# Patient Record
Sex: Female | Born: 1985
Health system: Southern US, Community
[De-identification: ages and names within clinical notes are randomized; demographics above are authoritative.]

## PROBLEM LIST (undated history)

## (undated) DIAGNOSIS — J45909 Unspecified asthma, uncomplicated: Secondary | ICD-10-CM

---

## 2008-05-18 ENCOUNTER — Emergency Department (HOSPITAL_COMMUNITY): Admission: EM | Admit: 2008-05-18 | Discharge: 2008-05-18 | Payer: Self-pay | Admitting: Emergency Medicine

## 2009-01-26 ENCOUNTER — Emergency Department (HOSPITAL_COMMUNITY): Admission: EM | Admit: 2009-01-26 | Discharge: 2009-01-26 | Payer: Self-pay | Admitting: Emergency Medicine

## 2010-06-30 LAB — URINE MICROSCOPIC-ADD ON

## 2010-06-30 LAB — URINALYSIS, ROUTINE W REFLEX MICROSCOPIC
Bilirubin Urine: NEGATIVE
Hgb urine dipstick: NEGATIVE
Ketones, ur: NEGATIVE mg/dL
Nitrite: NEGATIVE
Specific Gravity, Urine: 1.016 (ref 1.005–1.030)
pH: 5.5 (ref 5.0–8.0)

## 2010-06-30 LAB — POCT PREGNANCY, URINE: Preg Test, Ur: NEGATIVE

## 2017-02-21 ENCOUNTER — Encounter (HOSPITAL_COMMUNITY): Payer: Self-pay

## 2017-02-21 ENCOUNTER — Emergency Department (HOSPITAL_COMMUNITY)
Admission: EM | Admit: 2017-02-21 | Discharge: 2017-02-21 | Disposition: A | Discharge status: Home / Self Care | Payer: BLUE CROSS/BLUE SHIELD | Attending: Emergency Medicine | Admitting: Emergency Medicine

## 2017-02-21 ENCOUNTER — Other Ambulatory Visit: Payer: Self-pay

## 2017-02-21 ENCOUNTER — Emergency Department (HOSPITAL_COMMUNITY): Payer: BLUE CROSS/BLUE SHIELD

## 2017-02-21 DIAGNOSIS — Z87891 Personal history of nicotine dependence: Secondary | ICD-10-CM | POA: Insufficient documentation

## 2017-02-21 DIAGNOSIS — J40 Bronchitis, not specified as acute or chronic: Secondary | ICD-10-CM

## 2017-02-21 DIAGNOSIS — R05 Cough: Secondary | ICD-10-CM | POA: Diagnosis not present

## 2017-02-21 HISTORY — DX: Unspecified asthma, uncomplicated: J45.909

## 2017-02-21 MED ORDER — BENZONATATE 100 MG PO CAPS: 100.0000 mg | ORAL_CAPSULE | Freq: Three times a day (TID) | ORAL | 0 refills | Status: AC

## 2017-02-21 MED ORDER — PREDNISONE 20 MG PO TABS: 40.0000 mg | ORAL_TABLET | Freq: Every day | ORAL | 0 refills | Status: AC

## 2017-02-21 MED ORDER — ALBUTEROL SULFATE HFA 108 (90 BASE) MCG/ACT IN AERS
2.0000 | INHALATION_SPRAY | Freq: Once | RESPIRATORY_TRACT | Status: AC
  Administered 2017-02-21: 2 via RESPIRATORY_TRACT
  Filled 2017-02-21: qty 6.7

## 2017-02-21 NOTE — ED Provider Notes (Signed)
[facility] EMERGENCY DEPARTMENT Provider Note   CSN: [id number] Arrival date & time: 02/21/17  1021     History   Chief Complaint No chief complaint on file.   HPI Rachel HoseSharika Booker is a 31 y.o. female.  HPI Rachel Booker is a [age] y.o. female presents to emergency department complaining of cough for the last month.  Patient states her symptoms started with "head cold."  She states she no longer has any congestion, but still coughing.  She reports productive thick mucus.  She denies any fever or chills.  She denies any chest pain.  No shortness of breath.  She does have history of bronchitis.  She used to smoke, currently does not smoke.  She states she has tried Mucinex, Robitussin, Tylenol Cold and flu with no relief of her symptoms.  She states her symptoms are worse when she is lying down at nighttime, she states she hears her have wheezing.  She states nothing is making it better.  Past Medical History:  Diagnosis Date  . Asthma     There are no active problems to display for this patient.   History reviewed. No pertinent surgical history.  OB History    No data available       Home Medications    Prior to Admission medications   Not on File    Family History No family history on file.  Social History Social History   Tobacco Use  . Smoking status: Former Games developermoker  . Smokeless tobacco: Never Used  Substance Use Topics  . Alcohol use: Not on file  . Drug use: Not on file     Allergies   Patient has no known allergies.   Review of Systems Review of Systems  Constitutional: Negative for chills and fever.  Respiratory: Positive for cough and wheezing. Negative for chest tightness and shortness of breath.   Cardiovascular: Negative for chest pain, palpitations and leg swelling.  Gastrointestinal: Negative for abdominal pain, diarrhea, nausea and vomiting.  Genitourinary: Negative for dysuria, flank pain and pelvic pain.    Musculoskeletal: Negative for arthralgias, myalgias, neck pain and neck stiffness.  Skin: Negative for rash.  Neurological: Negative for dizziness, weakness and headaches.  All other systems reviewed and are negative.    Physical Exam Updated Vital Signs BP (!) 163/100   Pulse 93   Temp 98.4 F (36.9 C) (Oral)   Resp 18   LMP 02/20/2017   SpO2 100%   Physical Exam  Constitutional: She appears well-developed and well-nourished. No distress.  HENT:  Head: Normocephalic.  Right Ear: External ear normal.  Left Ear: External ear normal.  Nose: Nose normal.  Mouth/Throat: Oropharynx is clear and moist.  Eyes: Conjunctivae are normal.  Neck: Neck supple.  Cardiovascular: Normal rate, regular rhythm and normal heart sounds.  Pulmonary/Chest: Effort normal and breath sounds normal. No stridor. No respiratory distress. She has no wheezes. She has no rales.  Abdominal: Soft. Bowel sounds are normal. She exhibits no distension. There is no tenderness. There is no rebound.  Musculoskeletal: She exhibits no edema.  Neurological: She is alert.  Skin: Skin is warm and dry.  Psychiatric: She has a normal mood and affect. Her behavior is normal.  Nursing note and vitals reviewed.    ED Treatments / Results  Labs (all labs ordered are listed, but only abnormal results are displayed) Labs Reviewed - No data to display  EKG  EKG Interpretation None       Radiology  Dg Chest 2 View  Result Date: 02/21/2017 CLINICAL DATA:  Cough and congestion. EXAM: CHEST  2 VIEW COMPARISON:  01/26/2009. FINDINGS: Mediastinum hilar structures normal. Heart size stable. Mild left base subsegmental atelectasis. No pleural effusion or pneumothorax . IMPRESSION: Mild left base subsegmental atelectasis. Electronically Signed   By: Maisie Fushomas  Register   On: 02/21/2017 10:55    Procedures Procedures (including critical care time)  Medications Ordered in ED Medications - No data to display   Initial  Impression / Assessment and Plan / ED Course  I have reviewed the triage vital signs and the nursing notes.  Pertinent labs & imaging results that were available during my care of the patient were reviewed by me and considered in my medical decision making (see chart for details).     Patient in the emergency department with cough for 1 month, with productive thick sputum, wheezing mainly at nighttime.  She does have history of smoking and bronchitis.  Her exam was unremarkable here.  Her lungs are clear.  Her oxygen is 100% on room air.  She is in no distress.  Chest x-ray was obtained and is negative for an acute process.  Will treat with an inhaler for wheezing, prednisone for inflammation, Tessalon for cough.  Follow-up with family doctor.  Patient stable for discharge home with close outpatient follow-up.  Return precautions discussed.  Vitals:   02/21/17 1035  BP: (!) 163/100  Pulse: 93  Resp: 18  Temp: 98.4 F (36.9 C)  TempSrc: Oral  SpO2: 100%     Final Clinical Impressions(s) / ED Diagnoses   Final diagnoses:  Bronchitis    ED Discharge Orders        Ordered    predniSONE (DELTASONE) 20 MG tablet  Daily     02/21/17 1230    benzonatate (TESSALON) 100 MG capsule  Every 8 hours     02/21/17 1230       Jaynie CrumbleKirichenko, Jameisha Stofko, PA-C 02/21/17 1231    Phillis HaggisMabe, Martha L, MD 02/21/17 1302

## 2017-02-21 NOTE — ED Triage Notes (Signed)
Patient complains of 1 month of ongoing cough and congestion. Used otc meds with minimal; relief

## 2017-02-21 NOTE — Discharge Instructions (Signed)
Use your inhaler 2 puffs every 4 hours.  Take prednisone as prescribed until all gone.  Take Tessalon as needed for cough.  Follow-up with family doctor if not improving in 3-5 days.  Return if worsening symptoms.

## 2017-04-12 ENCOUNTER — Encounter: Payer: BLUE CROSS/BLUE SHIELD | Admitting: Obstetrics & Gynecology

## 2017-05-05 DIAGNOSIS — Z833 Family history of diabetes mellitus: Secondary | ICD-10-CM | POA: Diagnosis not present

## 2017-05-05 DIAGNOSIS — Z0000 Encounter for general adult medical examination without abnormal findings: Secondary | ICD-10-CM | POA: Diagnosis not present

## 2017-05-05 DIAGNOSIS — Z131 Encounter for screening for diabetes mellitus: Secondary | ICD-10-CM | POA: Diagnosis not present

## 2017-05-05 DIAGNOSIS — Z13220 Encounter for screening for lipoid disorders: Secondary | ICD-10-CM | POA: Diagnosis not present

## 2017-05-05 DIAGNOSIS — Z01419 Encounter for gynecological examination (general) (routine) without abnormal findings: Secondary | ICD-10-CM | POA: Diagnosis not present

## 2017-05-05 DIAGNOSIS — Z6841 Body Mass Index (BMI) 40.0 and over, adult: Secondary | ICD-10-CM | POA: Diagnosis not present

## 2017-05-05 DIAGNOSIS — L680 Hirsutism: Secondary | ICD-10-CM | POA: Diagnosis not present

## 2017-05-05 DIAGNOSIS — N938 Other specified abnormal uterine and vaginal bleeding: Secondary | ICD-10-CM | POA: Diagnosis not present

## 2017-05-29 DIAGNOSIS — D649 Anemia, unspecified: Secondary | ICD-10-CM | POA: Diagnosis not present

## 2017-05-29 DIAGNOSIS — N939 Abnormal uterine and vaginal bleeding, unspecified: Secondary | ICD-10-CM | POA: Diagnosis not present

## 2017-06-30 DIAGNOSIS — D649 Anemia, unspecified: Secondary | ICD-10-CM | POA: Diagnosis not present

## 2017-07-31 DIAGNOSIS — D649 Anemia, unspecified: Secondary | ICD-10-CM | POA: Diagnosis not present

## 2018-01-17 ENCOUNTER — Emergency Department (HOSPITAL_COMMUNITY)
Admission: EM | Admit: 2018-01-17 | Discharge: 2018-01-17 | Disposition: A | Discharge status: Home / Self Care | Payer: BLUE CROSS/BLUE SHIELD | Attending: Emergency Medicine | Admitting: Emergency Medicine

## 2018-01-17 ENCOUNTER — Encounter (HOSPITAL_COMMUNITY): Payer: Self-pay

## 2018-01-17 ENCOUNTER — Emergency Department (HOSPITAL_COMMUNITY): Payer: BLUE CROSS/BLUE SHIELD

## 2018-01-17 DIAGNOSIS — S83004A Unspecified dislocation of right patella, initial encounter: Secondary | ICD-10-CM

## 2018-01-17 DIAGNOSIS — Y999 Unspecified external cause status: Secondary | ICD-10-CM | POA: Diagnosis not present

## 2018-01-17 DIAGNOSIS — S8991XA Unspecified injury of right lower leg, initial encounter: Secondary | ICD-10-CM | POA: Diagnosis not present

## 2018-01-17 DIAGNOSIS — J45909 Unspecified asthma, uncomplicated: Secondary | ICD-10-CM | POA: Diagnosis not present

## 2018-01-17 DIAGNOSIS — Z87891 Personal history of nicotine dependence: Secondary | ICD-10-CM | POA: Insufficient documentation

## 2018-01-17 DIAGNOSIS — Y92410 Unspecified street and highway as the place of occurrence of the external cause: Secondary | ICD-10-CM | POA: Diagnosis not present

## 2018-01-17 DIAGNOSIS — Y9389 Activity, other specified: Secondary | ICD-10-CM | POA: Diagnosis not present

## 2018-01-17 DIAGNOSIS — Z79899 Other long term (current) drug therapy: Secondary | ICD-10-CM | POA: Insufficient documentation

## 2018-01-17 DIAGNOSIS — S4991XA Unspecified injury of right shoulder and upper arm, initial encounter: Secondary | ICD-10-CM | POA: Diagnosis not present

## 2018-01-17 DIAGNOSIS — M25561 Pain in right knee: Secondary | ICD-10-CM | POA: Diagnosis not present

## 2018-01-17 DIAGNOSIS — M25511 Pain in right shoulder: Secondary | ICD-10-CM | POA: Diagnosis not present

## 2018-01-17 MED ORDER — INDIGOTINDISULFONATE SODIUM 8 MG/ML IJ SOLN
INTRAMUSCULAR | Status: AC
  Filled 2018-01-17: qty 5

## 2018-01-17 MED ORDER — ACETAMINOPHEN 325 MG PO TABS
650.0000 mg | ORAL_TABLET | Freq: Once | ORAL | Status: AC
  Administered 2018-01-17: 650 mg via ORAL
  Filled 2018-01-17: qty 2
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
## 2018-01-17 NOTE — Discharge Instructions (Addendum)
Use brace and crutches while walking

## 2018-02-09 DIAGNOSIS — M25561 Pain in right knee: Secondary | ICD-10-CM | POA: Diagnosis not present

## 2018-02-09 DIAGNOSIS — M25511 Pain in right shoulder: Secondary | ICD-10-CM | POA: Diagnosis not present

## 2018-02-13 ENCOUNTER — Other Ambulatory Visit: Payer: Self-pay | Admitting: Orthopedic Surgery

## 2018-02-13 DIAGNOSIS — M25561 Pain in right knee: Secondary | ICD-10-CM

## 2018-02-13 DIAGNOSIS — M25511 Pain in right shoulder: Secondary | ICD-10-CM

## 2018-02-25 ENCOUNTER — Ambulatory Visit
Admission: RE | Admit: 2018-02-25 | Discharge: 2018-02-25 | Disposition: A | Discharge status: Home / Self Care | Payer: BLUE CROSS/BLUE SHIELD | Source: Ambulatory Visit | Attending: Orthopedic Surgery | Admitting: Orthopedic Surgery

## 2018-02-25 DIAGNOSIS — M25511 Pain in right shoulder: Secondary | ICD-10-CM

## 2018-02-25 DIAGNOSIS — M25561 Pain in right knee: Secondary | ICD-10-CM

## 2018-12-18 DIAGNOSIS — Z6841 Body Mass Index (BMI) 40.0 and over, adult: Secondary | ICD-10-CM | POA: Diagnosis not present

## 2018-12-18 DIAGNOSIS — Z01419 Encounter for gynecological examination (general) (routine) without abnormal findings: Secondary | ICD-10-CM | POA: Diagnosis not present

## 2018-12-18 DIAGNOSIS — D649 Anemia, unspecified: Secondary | ICD-10-CM | POA: Diagnosis not present

## 2019-07-13 ENCOUNTER — Ambulatory Visit: Payer: Self-pay | Attending: Internal Medicine

## 2019-07-13 DIAGNOSIS — Z23 Encounter for immunization: Secondary | ICD-10-CM

## 2019-07-13 NOTE — Progress Notes (Signed)
   Covid-19 Vaccination Clinic  Name:  Rachel Booker    MRN: [id number] DOB: 01/16/1986  07/13/2019  Ms. Rachel Booker was observed post Covid-19 immunization for 15 minutes without incident. She was provided with Vaccine Information Sheet and instruction to access the V-Safe system.   Ms. Rachel Booker was instructed to call 911 with any severe reactions post vaccine: Marland Kitchen Difficulty breathing  . Swelling of face and throat  . A fast heartbeat  . A bad rash all over body  . Dizziness and weakness   Immunizations Administered    Name Date Dose VIS Date Route   Pfizer COVID-19 Vaccine 07/13/2019 10:06 AM 0.3 mL 03/08/2019 Intramuscular   Manufacturer: [employer], [employer]   Lot: [id number]   NDC: [address]-7

## 2019-08-05 ENCOUNTER — Ambulatory Visit: Payer: Self-pay | Attending: Internal Medicine

## 2019-08-05 DIAGNOSIS — Z23 Encounter for immunization: Secondary | ICD-10-CM

## 2019-08-05 NOTE — Progress Notes (Signed)
   Covid-19 Vaccination Clinic  Name:  Ravin Bendall    MRN: [id number] DOB: 04/23/1985  08/05/2019  Ms. Cherry was observed post Covid-19 immunization for 15 minutes without incident. She was provided with Vaccine Information Sheet and instruction to access the V-Safe system.   Ms. Delio was instructed to call 911 with any severe reactions post vaccine: Marland Kitchen Difficulty breathing  . Swelling of face and throat  . A fast heartbeat  . A bad rash all over body  . Dizziness and weakness   Immunizations Administered    Name Date Dose VIS Date Route   Pfizer COVID-19 Vaccine 08/05/2019  9:54 AM 0.3 mL 05/22/2018 Intramuscular   Manufacturer: [employer], [employer]   Lot: [id number]   NDC: [address]-2
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
## 2023-10-26 ENCOUNTER — Other Ambulatory Visit: Payer: Self-pay

## 2023-10-26 ENCOUNTER — Emergency Department (HOSPITAL_COMMUNITY)

## 2023-10-26 ENCOUNTER — Emergency Department (HOSPITAL_COMMUNITY)
Admission: EM | Admit: 2023-10-26 | Discharge: 2023-10-26 | Disposition: A | Discharge status: Home / Self Care | Attending: Student | Admitting: Student

## 2023-10-26 ENCOUNTER — Encounter (HOSPITAL_COMMUNITY): Payer: Self-pay | Admitting: Emergency Medicine

## 2023-10-26 DIAGNOSIS — Z87891 Personal history of nicotine dependence: Secondary | ICD-10-CM | POA: Diagnosis not present

## 2023-10-26 DIAGNOSIS — J45909 Unspecified asthma, uncomplicated: Secondary | ICD-10-CM | POA: Insufficient documentation

## 2023-10-26 DIAGNOSIS — M25562 Pain in left knee: Secondary | ICD-10-CM | POA: Diagnosis not present

## 2023-10-26 DIAGNOSIS — M25561 Pain in right knee: Secondary | ICD-10-CM | POA: Diagnosis present

## 2023-10-26 DIAGNOSIS — M25522 Pain in left elbow: Secondary | ICD-10-CM | POA: Diagnosis not present

## 2023-10-26 MED ORDER — NAPROXEN 500 MG PO TABS
500.0000 mg | ORAL_TABLET | Freq: Once | ORAL | Status: AC
  Administered 2023-10-26: 500 mg via ORAL
  Filled 2023-10-26: qty 1
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
